# Patient Record
Sex: Male | Born: 1965 | Race: White | Hispanic: No | Marital: Married | Smoking: Never smoker
Health system: Southern US, Community
[De-identification: ages and names within clinical notes are randomized; demographics above are authoritative.]

## PROBLEM LIST (undated history)

## (undated) DIAGNOSIS — M722 Plantar fascial fibromatosis: Secondary | ICD-10-CM

## (undated) HISTORY — DX: Plantar fascial fibromatosis: M72.2

---

## 2001-09-25 ENCOUNTER — Emergency Department (HOSPITAL_COMMUNITY): Admission: EM | Admit: 2001-09-25 | Discharge: 2001-09-25 | Payer: Self-pay | Admitting: Emergency Medicine

## 2004-02-29 ENCOUNTER — Emergency Department (HOSPITAL_COMMUNITY): Admission: EM | Admit: 2004-02-29 | Discharge: 2004-02-29 | Payer: Self-pay | Admitting: *Deleted

## 2004-03-05 ENCOUNTER — Emergency Department (HOSPITAL_COMMUNITY): Admission: EM | Admit: 2004-03-05 | Discharge: 2004-03-05 | Payer: Self-pay | Admitting: *Deleted

## 2010-04-07 ENCOUNTER — Emergency Department (HOSPITAL_BASED_OUTPATIENT_CLINIC_OR_DEPARTMENT_OTHER): Admission: EM | Admit: 2010-04-07 | Discharge: 2010-04-07 | Payer: Self-pay | Admitting: Emergency Medicine

## 2010-04-23 ENCOUNTER — Ambulatory Visit (HOSPITAL_COMMUNITY): Admission: RE | Admit: 2010-04-23 | Discharge: 2010-04-23 | Payer: Self-pay | Admitting: Urology

## 2010-09-25 ENCOUNTER — Encounter: Payer: Self-pay | Admitting: Urology

## 2010-11-19 LAB — URINALYSIS, ROUTINE W REFLEX MICROSCOPIC
Bilirubin Urine: NEGATIVE
Nitrite: NEGATIVE
Protein, ur: 30 mg/dL — AB
Specific Gravity, Urine: 1.033 — ABNORMAL HIGH (ref 1.005–1.030)
Urobilinogen, UA: 0.2 mg/dL (ref 0.0–1.0)

## 2010-11-19 LAB — URINE MICROSCOPIC-ADD ON

## 2012-03-18 IMAGING — CT CT ABD-PELV W/O CM
2 series · 14 of 42 positions shown, 19 images · non-contrast
Comparison: CT abdomen and pelvis 03/05/2004.

CLINICAL DATA: Right flank pain.

CT ABDOMEN AND PELVIS WITHOUT CONTRAST
TECHNIQUE: Multidetector CT imaging of the abdomen and pelvis was
performed following the standard protocol without intravenous
contrast.

[Series 602: <mpr thick range> · coronal · 0.94mm/px · 13 of 97 slices shown, 17 images]
[im 7/97  soft-tissue]
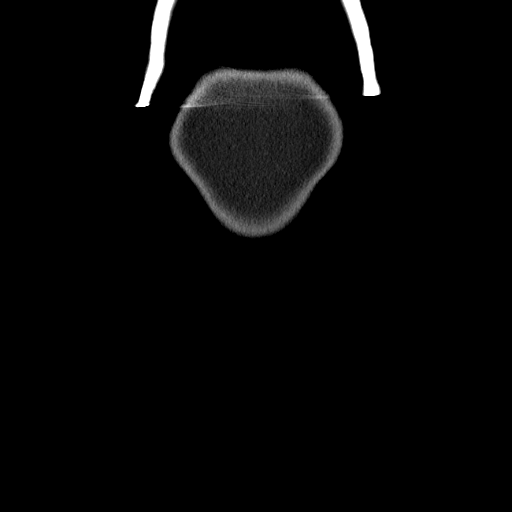
[im 7/97  lung]
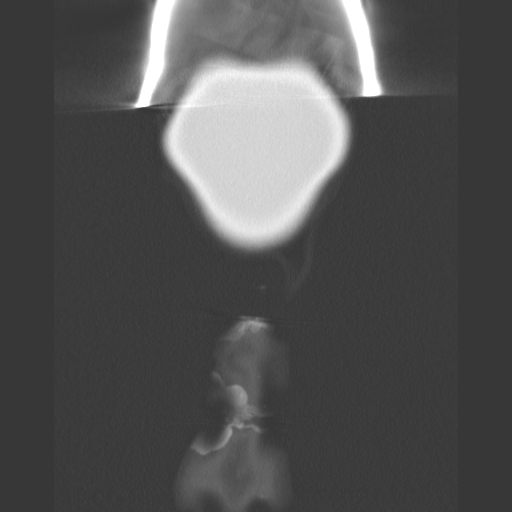
[im 7/97  bone]
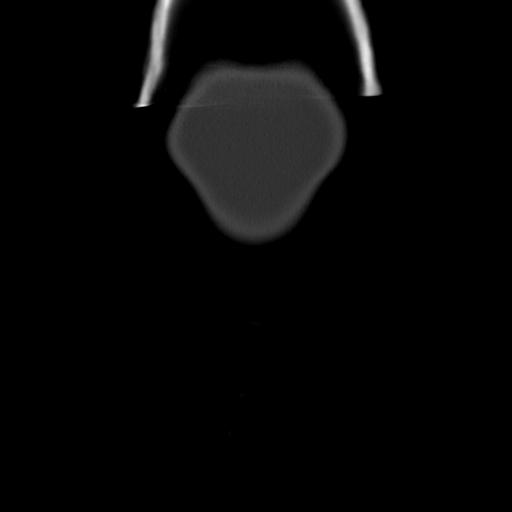
[im 14/97  soft-tissue]
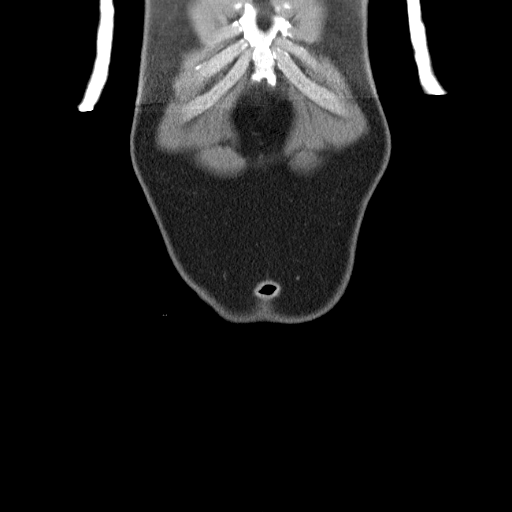
[im 14/97  lung]
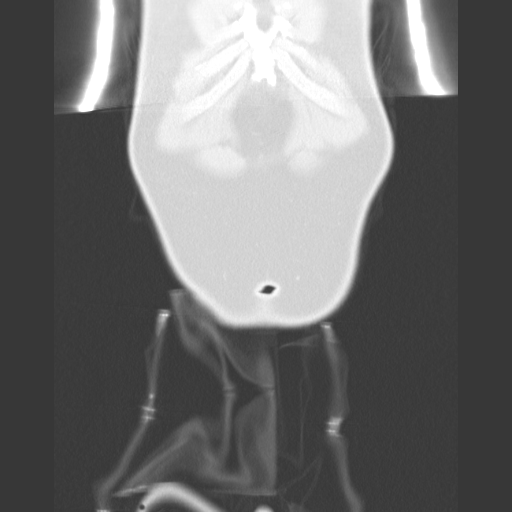
[im 21/97  lung]
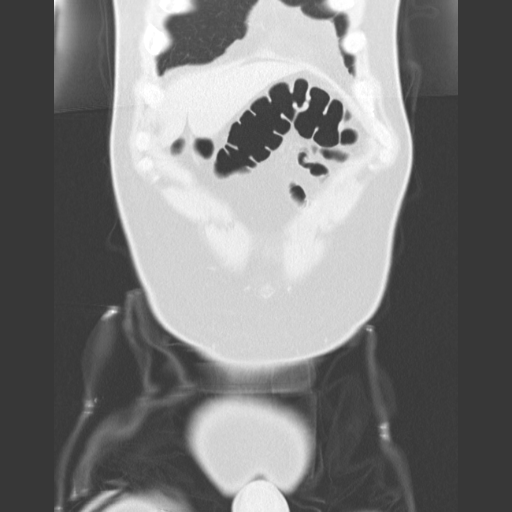
[im 22/97  soft-tissue]
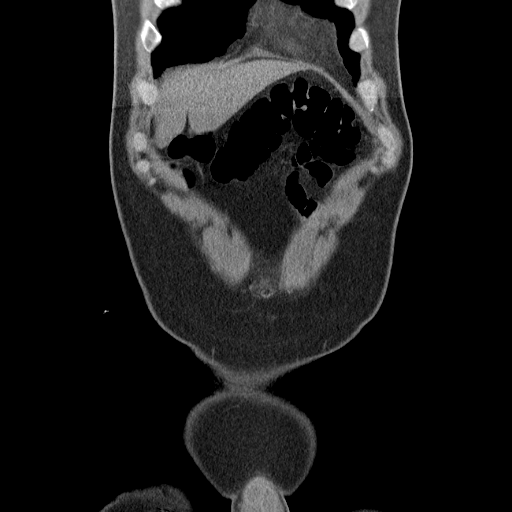
[im 28/97  lung]
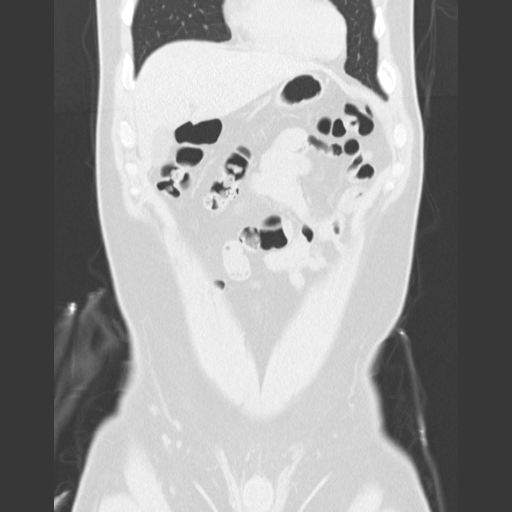
[im 33/97  soft-tissue]
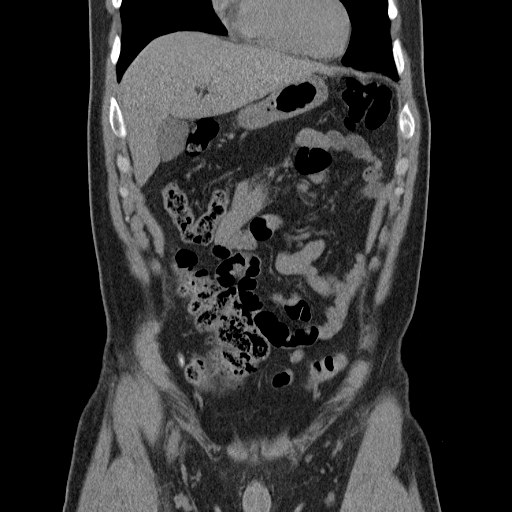
[im 42/97  soft-tissue]
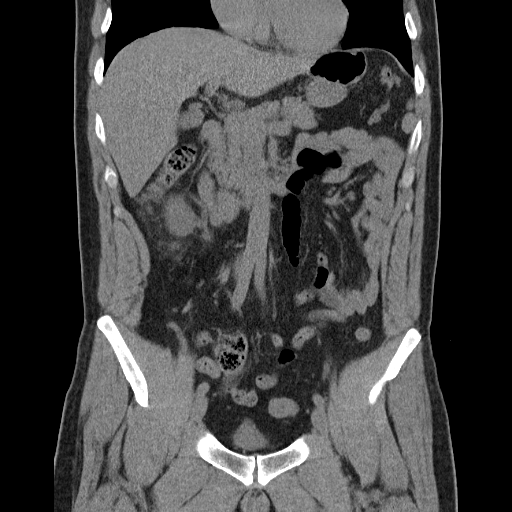
[im 49/97  soft-tissue]
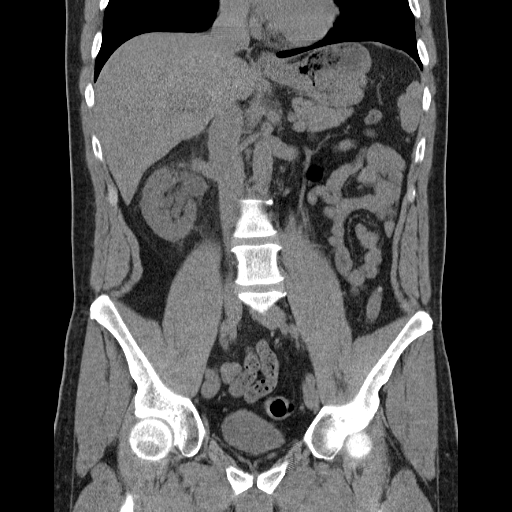
[im 55/97  soft-tissue]
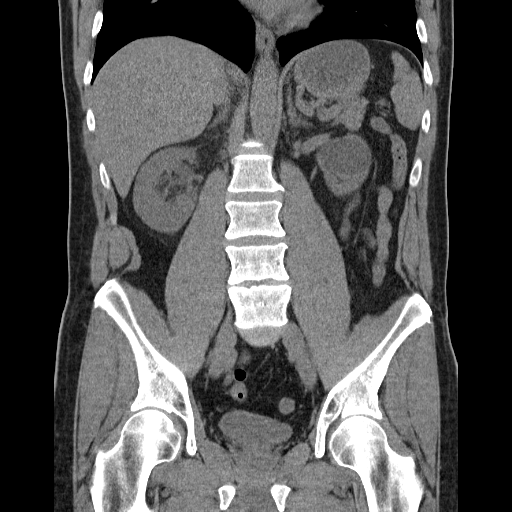
[im 65/97  soft-tissue]
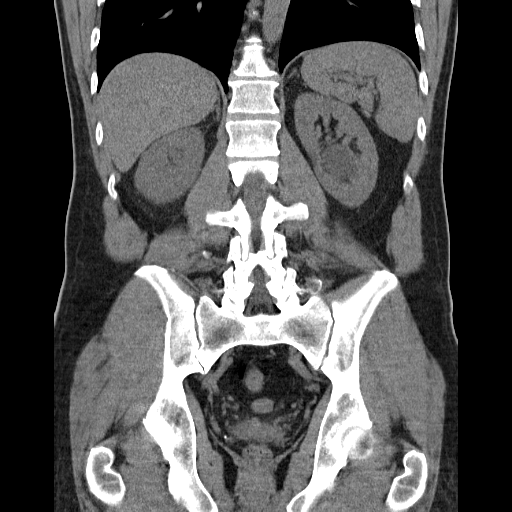
[im 75/97  soft-tissue]
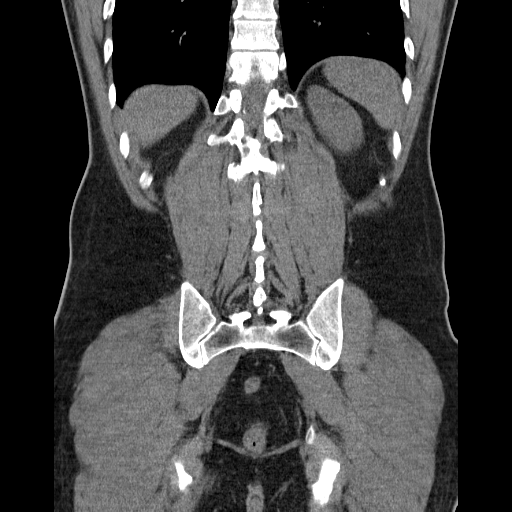
[im 75/97  bone]
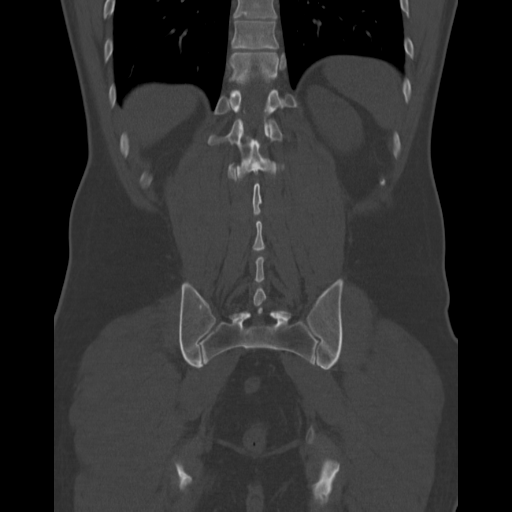
[im 83/97  soft-tissue]
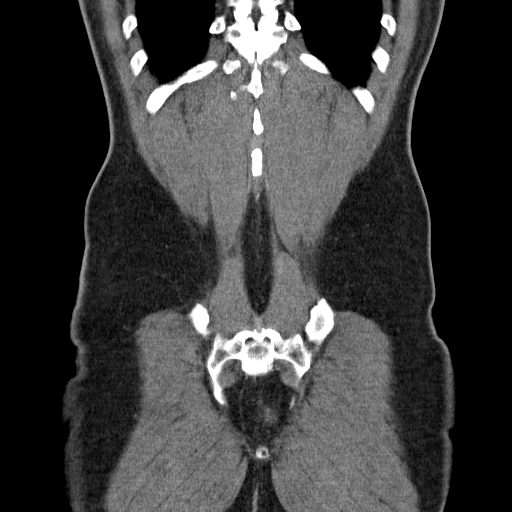
[im 90/97  soft-tissue]
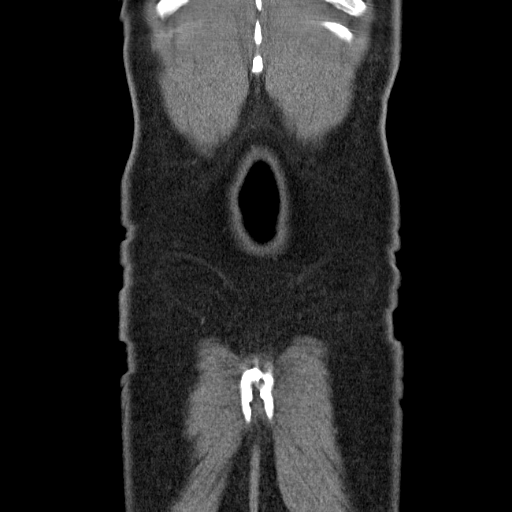

[Series 603: <mpr thick range(1)> · sagittal · 0.94mm/px · 1 of 113 slices shown, 2 images]
[im 57/113  soft-tissue]
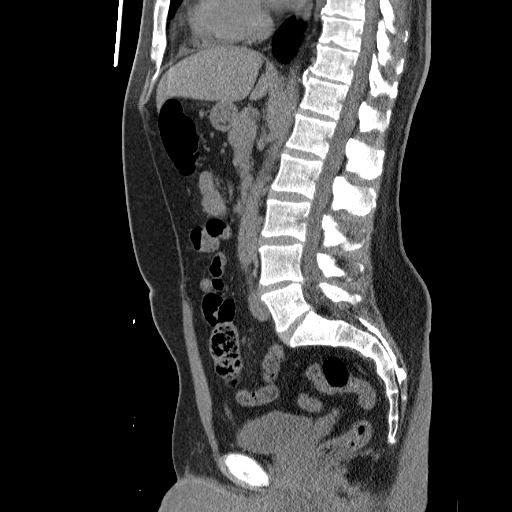
[im 57/113  bone]
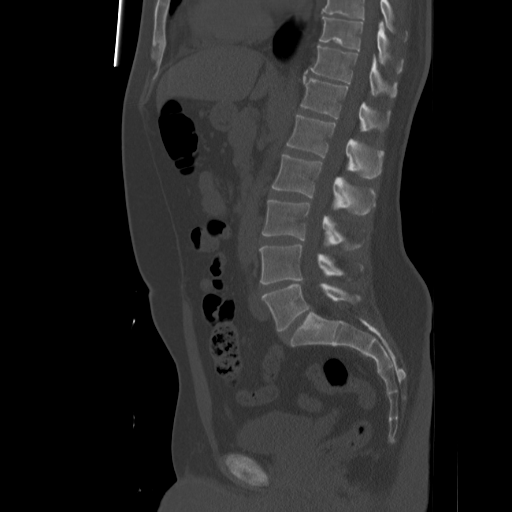

[14 of 42 positions shown; findings below may reference images not displayed]

FINDINGS: The lung bases are clear.  No pleural or pericardial
effusion.

There is stranding about the right kidney and ureter with moderate
right hydronephrosis due to a 0.4 cm stone at the right
ureterovesical junction.  Punctate nonobstructing stone is seen in
the lower pole of the left kidney.  Cyst in the lower pole left
kidney has increased in size measuring 5.5 cm compared to 3.0 cm on
the prior study.  No left ureteral stones.

The gallbladder, liver, spleen, adrenal glands and pancreas appear
normal.  The stomach, small bowel and appendix appear normal.  A
few colonic diverticula seen but there is no evidence of
diverticulitis.  No focal bony abnormality.
IMPRESSION: 1.  Moderate right hydronephrosis due to a 0.4 cm stone at the
right ureteral vesicle junction.
2.  Punctate nonobstructing stone lower pole left kidney.  Cyst in
the lower pole left kidney on prior study has increased in size.
3.  Mild diverticulosis without diverticulitis.

## 2013-07-04 ENCOUNTER — Encounter (INDEPENDENT_AMBULATORY_CARE_PROVIDER_SITE_OTHER): Payer: Self-pay | Admitting: Surgery

## 2013-07-04 ENCOUNTER — Ambulatory Visit (INDEPENDENT_AMBULATORY_CARE_PROVIDER_SITE_OTHER): Payer: BC Managed Care – PPO | Admitting: Surgery

## 2013-07-04 VITALS — BP 142/90 | HR 105 | Temp 98.6°F | Resp 18 | Ht 77.0 in | Wt 261.4 lb

## 2013-07-04 DIAGNOSIS — K645 Perianal venous thrombosis: Secondary | ICD-10-CM | POA: Insufficient documentation

## 2013-07-04 NOTE — Progress Notes (Signed)
[facility]  Ovidio Kin, MD,  FACS [address] [place]  [address] [place], [place]    [address] Phone:  [phone] FAX:  [phone]   Re:   Francis Bradley DOB:   12-15-1965 MRN:   [id number]  Urgent Office  ASSESSMENT AND PLAN: 1.  Thrombosed hemorrhoid  I did an I&D of clot in office.  I gave him a book on hemorrhoid disease.  He will do sitz baths at home 2 or 3 times a day.  He already has regular BM, except last week.  His return appointment is PRN.  2.  History of kidney stones.  HISTORY OF PRESENT ILLNESS: Chief Complaint  Patient presents with  . Follow-up    thromb. hem    Francis Bradley is a 47 y.o. (DOB: August 20, 1966) bearded white  male who is a patient of No primary provider on file. and comes to the Urgent Office with a thrombosed hemorrhoid.  His mother is Tahjai Schetter, she used to work at [employer] and she helped him get the appointment.  About 1 week ago the patient ate something that upset his stomach.  About 2 days ago, he felt a mass in his left rectum which became increasing painful. At first, he used some salves, such as Preparation H, but this did not help. Then he started doing some sitz baths which helped the rectal pain some.  He has had no prior hemorrhoid disease.  He has no history of peptic ulcer disease, liver disease, colon disease, or colitis. He's had no prior rectal disease.  No past medical history on file.  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 His son is 78 yo. His wife works in [employer].  PHYSICAL EXAM: BP 142/90  Pulse 105  Temp(Src) 98.6 F (37 C)  Resp 18  Ht 6\' 5"  (1.956 m)  Wt 261 lb 6.4 oz (118.57 kg)  BMI 30.99 kg/m2  Rectum:  He has a 2.5 cm thrombosed hemorrhoid on the left side of the rectum.  His rectum/anus other wise is okay.  Procedure:  Painted the hemorrhoid with betadine.  Infiltrated 4 cc of 1% xylocaine.  Expressed several large clots out of the hemorrhoid.  Dressed with  gauze.  DATA REVIEWED: None  Ovidio Kin, MD,  [facility], [place] [address] [place]  [address]   [place], [place]    [address] Phone:  [phone] FAX:  [phone]

## 2017-09-24 DIAGNOSIS — M545 Low back pain: Secondary | ICD-10-CM | POA: Diagnosis not present

## 2017-09-24 DIAGNOSIS — M79605 Pain in left leg: Secondary | ICD-10-CM | POA: Diagnosis not present

## 2017-09-24 DIAGNOSIS — M5417 Radiculopathy, lumbosacral region: Secondary | ICD-10-CM | POA: Diagnosis not present

## 2017-09-24 DIAGNOSIS — R200 Anesthesia of skin: Secondary | ICD-10-CM | POA: Diagnosis not present

## 2017-09-26 DIAGNOSIS — M5417 Radiculopathy, lumbosacral region: Secondary | ICD-10-CM | POA: Diagnosis not present

## 2017-09-26 DIAGNOSIS — M545 Low back pain: Secondary | ICD-10-CM | POA: Diagnosis not present

## 2017-10-06 DIAGNOSIS — M5416 Radiculopathy, lumbar region: Secondary | ICD-10-CM | POA: Diagnosis not present

## 2017-10-13 DIAGNOSIS — M545 Low back pain: Secondary | ICD-10-CM | POA: Diagnosis not present

## 2017-10-13 DIAGNOSIS — M5126 Other intervertebral disc displacement, lumbar region: Secondary | ICD-10-CM | POA: Diagnosis not present

## 2017-10-13 DIAGNOSIS — M5416 Radiculopathy, lumbar region: Secondary | ICD-10-CM | POA: Diagnosis not present

## 2018-12-11 DIAGNOSIS — J309 Allergic rhinitis, unspecified: Secondary | ICD-10-CM | POA: Diagnosis not present

## 2018-12-11 DIAGNOSIS — J22 Unspecified acute lower respiratory infection: Secondary | ICD-10-CM | POA: Diagnosis not present

## 2018-12-11 DIAGNOSIS — R509 Fever, unspecified: Secondary | ICD-10-CM | POA: Diagnosis not present

## 2018-12-12 ENCOUNTER — Ambulatory Visit: Payer: Self-pay | Admitting: Family Medicine

## 2019-03-22 ENCOUNTER — Encounter: Payer: Self-pay | Admitting: Family Medicine

## 2019-03-22 ENCOUNTER — Ambulatory Visit (INDEPENDENT_AMBULATORY_CARE_PROVIDER_SITE_OTHER): Payer: BC Managed Care – PPO | Admitting: Family Medicine

## 2019-03-22 ENCOUNTER — Other Ambulatory Visit: Payer: Self-pay

## 2019-03-22 VITALS — BP 142/102 | HR 82 | Temp 98.0°F | Resp 16 | Ht 73.0 in | Wt 274.2 lb

## 2019-03-22 DIAGNOSIS — R030 Elevated blood-pressure reading, without diagnosis of hypertension: Secondary | ICD-10-CM | POA: Diagnosis not present

## 2019-03-22 DIAGNOSIS — Z0000 Encounter for general adult medical examination without abnormal findings: Secondary | ICD-10-CM | POA: Diagnosis not present

## 2019-03-22 DIAGNOSIS — Z1211 Encounter for screening for malignant neoplasm of colon: Secondary | ICD-10-CM | POA: Diagnosis not present

## 2019-03-22 DIAGNOSIS — Z1212 Encounter for screening for malignant neoplasm of rectum: Secondary | ICD-10-CM

## 2019-03-22 DIAGNOSIS — Z23 Encounter for immunization: Secondary | ICD-10-CM

## 2019-03-22 LAB — LIPID PANEL
Cholesterol: 209 mg/dL — ABNORMAL HIGH (ref 0–200)
HDL: 38.2 mg/dL — ABNORMAL LOW (ref >39.00)
LDL Cholesterol: 142 mg/dL — ABNORMAL HIGH (ref 0–99)
NonHDL: 170.77
Total CHOL/HDL Ratio: 5
Triglycerides: 142 mg/dL (ref 0.0–149.0)
VLDL: 28.4 mg/dL (ref 0.0–40.0)

## 2019-03-22 LAB — CBC WITH DIFFERENTIAL/PLATELET
Basophils Absolute: 0 10*3/uL (ref 0.0–0.1)
Basophils Relative: 0.6 % (ref 0.0–3.0)
Eosinophils Absolute: 0.2 10*3/uL (ref 0.0–0.7)
Eosinophils Relative: 3.8 % (ref 0.0–5.0)
HCT: 43.5 % (ref 39.0–52.0)
Hemoglobin: 14.4 g/dL (ref 13.0–17.0)
Lymphocytes Relative: 26.9 % (ref 12.0–46.0)
Lymphs Abs: 1.4 10*3/uL (ref 0.7–4.0)
MCHC: 33.1 g/dL (ref 30.0–36.0)
MCV: 91.1 fl (ref 78.0–100.0)
Monocytes Absolute: 0.3 10*3/uL (ref 0.1–1.0)
Monocytes Relative: 6.1 % (ref 3.0–12.0)
Neutro Abs: 3.3 10*3/uL (ref 1.4–7.7)
Neutrophils Relative %: 62.6 % (ref 43.0–77.0)
Platelets: 205 10*3/uL (ref 150.0–400.0)
RBC: 4.77 Mil/uL (ref 4.22–5.81)
RDW: 14.1 % (ref 11.5–15.5)
WBC: 5.3 10*3/uL (ref 4.0–10.5)

## 2019-03-22 LAB — COMPREHENSIVE METABOLIC PANEL
ALT: 21 U/L (ref 0–53)
AST: 23 U/L (ref 0–37)
Albumin: 4.5 g/dL (ref 3.5–5.2)
Alkaline Phosphatase: 77 U/L (ref 39–117)
BUN: 13 mg/dL (ref 6–23)
CO2: 28 mEq/L (ref 19–32)
Calcium: 9.3 mg/dL (ref 8.4–10.5)
Chloride: 107 mEq/L (ref 96–112)
Creatinine, Ser: 1.19 mg/dL (ref 0.40–1.50)
GFR: 64.48 mL/min (ref >60.00)
Glucose, Bld: 98 mg/dL (ref 70–99)
Potassium: 4.4 mEq/L (ref 3.5–5.1)
Sodium: 142 mEq/L (ref 135–145)
Total Bilirubin: 1 mg/dL (ref 0.2–1.2)
Total Protein: 6.8 g/dL (ref 6.0–8.3)

## 2019-03-22 NOTE — Patient Instructions (Addendum)
Please return in 12 months for your annual complete physical; please come fasting. 3-4 weeks if home BP readings are elevated.   Your blood pressure is elevated today. We need more readings to see if this is consistently elevated.  Please purchase a home blood pressure cuff and start logging readings, daily or so.  Then f/u with me in 3-4 weeks if readings > 135/85 so I may review them and help.  It is helpful to limit salt in your diet.  Mild weight loss will also be helpful.   I will release your lab results to you on your MyChart account with further instructions. Please reply with any questions.   Today you were given your Tdap vaccination. Your arm may be sore in a day or two. advil is fine to help with this.  I recommend the Cologuard test for your colon cancer screening that is due. I have ordered this test for you. [facility] will soon contact you to verify your insurance, address etc. They will then send you the kit; follow the instructions in the kit and return the kit to [facility]. They will run the test and send the results to me. I will then give you the results. If this test is negative, we recommend repeating a colon cancer screening test in 3 years. If it is positive, I will refer you to a Gastroenterologist so you can get set up for the recommended colonoscopy.  Thank you!   It was a pleasure meeting you today! Thank you for choosing [place] to meet your healthcare needs! I truly look forward to working with you. If you have any questions or concerns, please send me a message via Mychart or call the office at [phone].   Preventive Care 20-15 Years Old, Male Preventive care refers to lifestyle choices and visits with your health care provider that can promote health and wellness. This includes:  A yearly physical exam. This is also called an annual well check.  Regular dental and eye exams.  Immunizations.  Screening for certain conditions.  Healthy lifestyle  choices, such as eating a healthy diet, getting regular exercise, not using drugs or products that contain nicotine and tobacco, and limiting alcohol use. What can I expect for my preventive care visit? Physical exam Your health care provider will check:  Height and weight. These may be used to calculate body mass index (BMI), which is a measurement that tells if you are at a healthy weight.  Heart rate and blood pressure.  Your skin for abnormal spots. Counseling Your health care provider may ask you questions about:  Alcohol, tobacco, and drug use.  Emotional well-being.  Home and relationship well-being.  Sexual activity.  Eating habits.  Work and work Statistician. What immunizations do I need?  Influenza (flu) vaccine  This is recommended every year. Tetanus, diphtheria, and pertussis (Tdap) vaccine  You may need a Td booster every 10 years. Varicella (chickenpox) vaccine  You may need this vaccine if you have not already been vaccinated. Zoster (shingles) vaccine  You may need this after age 56. Measles, mumps, and rubella (MMR) vaccine  You may need at least one dose of MMR if you were born in 1957 or later. You may also need a second dose. Pneumococcal conjugate (PCV13) vaccine  You may need this if you have certain conditions and were not previously vaccinated. Pneumococcal polysaccharide (PPSV23) vaccine  You may need one or two doses if you smoke cigarettes or if you have  certain conditions. Meningococcal conjugate (MenACWY) vaccine  You may need this if you have certain conditions. Hepatitis A vaccine  You may need this if you have certain conditions or if you travel or work in places where you may be exposed to hepatitis A. Hepatitis B vaccine  You may need this if you have certain conditions or if you travel or work in places where you may be exposed to hepatitis B. Haemophilus influenzae type b (Hib) vaccine  You may need this if you have  certain risk factors. Human papillomavirus (HPV) vaccine  If recommended by your health care provider, you may need three doses over 6 months. You may receive vaccines as individual doses or as more than one vaccine together in one shot (combination vaccines). Talk with your health care provider about the risks and benefits of combination vaccines. What tests do I need? Blood tests  Lipid and cholesterol levels. These may be checked every 5 years, or more frequently if you are over 58 years old.  Hepatitis C test.  Hepatitis B test. Screening  Lung cancer screening. You may have this screening every year starting at age 43 if you have a 30-pack-year history of smoking and currently smoke or have quit within the past 15 years.  Prostate cancer screening. Recommendations will vary depending on your family history and other risks.  Colorectal cancer screening. All adults should have this screening starting at age 51 and continuing until age 73. Your health care provider may recommend screening at age 66 if you are at increased risk. You will have tests every 1-10 years, depending on your results and the type of screening test.  Diabetes screening. This is done by checking your blood sugar (glucose) after you have not eaten for a while (fasting). You may have this done every 1-3 years.  Sexually transmitted disease (STD) testing. Follow these instructions at home: Eating and drinking  Eat a diet that includes fresh fruits and vegetables, whole grains, lean protein, and low-fat dairy products.  Take vitamin and mineral supplements as recommended by your health care provider.  Do not drink alcohol if your health care provider tells you not to drink.  If you drink alcohol: ? Limit how much you have to 0-2 drinks a day. ? Be aware of how much alcohol is in your drink. In the [place] one drink equals one 12 oz bottle of beer (355 mL), one 5 oz glass of wine (148 mL), or one 1 oz glass of  hard liquor (44 mL). Lifestyle  Take daily care of your teeth and gums.  Stay active. Exercise for at least 30 minutes on 5 or more days each week.  Do not use any products that contain nicotine or tobacco, such as cigarettes, e-cigarettes, and chewing tobacco. If you need help quitting, ask your health care provider.  If you are sexually active, practice safe sex. Use a condom or other form of protection to prevent STIs (sexually transmitted infections).  Talk with your health care provider about taking a low-dose aspirin every day starting at age 84. What's next?  Go to your health care provider once a year for a well check visit.  Ask your health care provider how often you should have your eyes and teeth checked.  Stay up to date on all vaccines. This information is not intended to replace advice given to you by your health care provider. Make sure you discuss any questions you have with your health care provider. Document Released:  09/18/2015 Document Revised: 08/16/2018 Document Reviewed: 08/16/2018 Elsevier Patient Education  [place].

## 2019-03-22 NOTE — Progress Notes (Signed)
Subjective  Chief Complaint  Patient presents with  . Establish Care    Has not had a PCP in several years  . Annual Exam    He is fasting   HPI: Francis Bradley is a 53 y.o. male who presents to [facility] today for a Male Wellness Visit.   Wellness Visit: annual visit with health maintenance review and exam    Very pleasant 53 yo healthy male for cpe. No concerns. Overdue for health screens. Feels great.   PMH sig for plantar fasciitis and secondary back pain now resolved with orthotics.   FH: + for CAD in older brother, onset 39yo; dad with high cholesterol and copd (smoker).  Married, wife works, has 99 yo daughter. occ etoh, non smoker.   HM: due for CRC screen and tdap; fasting for labs.   Lifestyle: Body mass index is 36.18 kg/m. 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 Prolapsed lumbar disc 10/13/2017  . Thrombosed external hemorrhoid 07/04/2013   Health Maintenance  Topic Date Due  . HIV Screening  05/09/1983  . TETANUS/TDAP  05/09/1987  . COLONOSCOPY  05/08/2018  . INFLUENZA VACCINE  04/06/2019   Immunization History  Administered Date(s) Administered  . Tdap 03/22/2019   We updated and reviewed the patient's past history in detail and it is documented below. Allergies: Patient has No Known Allergies. Past Medical History  has a past medical history of Plantar fasciitis. Past Surgical History  has no past surgical history on file. Social History Patient  reports that he has never smoked. He has never used smokeless tobacco. He reports current alcohol use. He reports that he does not use drugs. Family History Patient family history includes COPD in his father; Healthy in his daughter and mother; Heart disease (age of onset: 29)  in his brother; Hypertension in his father. Review of Systems: Constitutional: negative for fever or malaise Ophthalmic: negative for photophobia, double vision or loss of vision Cardiovascular: negative for chest pain, dyspnea on exertion, or new LE swelling Respiratory: negative for SOB or persistent cough Gastrointestinal: negative for abdominal pain, change in bowel habits or melena Genitourinary: negative for dysuria or gross hematuria Musculoskeletal: negative for new gait disturbance or muscular weakness Integumentary: negative for new or persistent rashes, no breast lumps Neurological: negative for TIA or stroke symptoms Psychiatric: negative for SI or delusions Allergic/Immunologic: negative for hives  Patient Care Team    Relationship Specialty Notifications Start End  Leamon Arnt, MD PCP - General Family Medicine  03/22/19    Objective  Vitals: BP (!) 142/102   Pulse 82   Temp 98 F (36.7 C) (Oral)   Resp 16   Ht 6\' 1"  (1.854 m)   Wt 274 lb 3.2 oz (124.4 kg)   SpO2 98%   BMI 36.18 kg/m  General:  Well developed, well nourished, no acute distress  Psych:  Alert and orientedx3,normal mood and affect HEENT:  Normocephalic, atraumatic, non-icteric sclera, PERRL, oropharynx is clear without mass or exudate, supple neck without adenopathy, mass or thyromegaly Cardiovascular:  Normal S1, S2, RRR without gallop, rub or murmur, nondisplaced PMI, +2 distal pulses in bilateral upper and lower extremities. Respiratory:  Good breath sounds bilaterally, CTAB with normal respiratory  effort Gastrointestinal: normal bowel sounds, soft, non-tender, no noted masses. No HSM MSK: no deformities, contusions. Joints are without erythema or swelling. Spine and CVA region are nontender Skin:  Warm, no rashes or suspicious lesions noted Neurologic:    Mental status is normal. CN 2-11 are normal. Gross motor and sensory exams are normal. Stable gait. No tremor GU: No inguinal hernias or  adenopathy are appreciated bilaterally  Assessment  1. Annual physical exam   2. Screening for colorectal cancer   3. Elevated blood pressure reading without diagnosis of hypertension      Plan  Male Wellness Visit:  Age appropriate Health Maintenance and Prevention measures were discussed with patient. Included topics are cancer screening recommendations, ways to keep healthy (see AVS) including dietary and exercise recommendations, regular eye and dental care, use of seat belts, and avoidance of moderate alcohol use and tobacco use. cologuard chosen for crc screen. Education provided.  BMI: discussed patient's BMI and encouraged positive lifestyle modifications to help get to or maintain a target BMI.  HM needs and immunizations were addressed and ordered. See below for orders. See HM and immunization section for updates.  Routine labs and screening tests ordered including cmp, cbc and lipids where appropriate.  Discussed recommendations regarding Vit D and calcium supplementation (see AVS)  Follow up: 12 months for complete physical, 6 weeks of home blood pressure monitoring shows consistently elevated readings.  See after visit summary.  Commons side effects, risks, benefits, and alternatives for medications and treatment plan prescribed today were discussed, and the patient expressed understanding of the given instructions. Patient is instructed to call or message via MyChart if he/she has any questions or concerns regarding our treatment plan. No barriers to understanding were identified. We discussed Red Flag symptoms and signs in detail. Patient expressed understanding regarding what to do in case of urgent or emergency type symptoms.   Medication list was reconciled, printed and provided to the patient in AVS. Patient instructions and summary information was reviewed with the patient as documented in the AVS. This note was prepared with assistance of Dragon voice recognition  software. Occasional wrong-word or sound-a-like substitutions may have occurred due to the inherent limitations of voice recognition software  Orders Placed This Encounter  Procedures  . Tdap vaccine greater than or equal to 7yo IM  . CBC with Differential/Platelet  . Comprehensive metabolic panel  . Lipid panel  . HIV Antibody (routine testing w rflx)  . Cologuard   No orders of the defined types were placed in this encounter.

## 2019-03-23 LAB — HIV ANTIBODY (ROUTINE TESTING W REFLEX): HIV 1&2 Ab, 4th Generation: NONREACTIVE

## 2022-05-30 ENCOUNTER — Encounter: Payer: Self-pay | Admitting: *Deleted

## 2022-08-18 ENCOUNTER — Encounter: Payer: Self-pay | Admitting: *Deleted
